# Patient Record
Sex: Male | Born: 1980 | Race: White | Hispanic: No | Marital: Single | Smoking: Current some day smoker
Health system: Southern US, Community
[De-identification: ages and names within clinical notes are randomized; demographics above are authoritative.]

## PROBLEM LIST (undated history)

## (undated) DIAGNOSIS — I10 Essential (primary) hypertension: Secondary | ICD-10-CM

## (undated) HISTORY — PX: TONSILLECTOMY: SUR1361

---

## 2020-11-08 ENCOUNTER — Other Ambulatory Visit: Payer: Self-pay

## 2020-11-08 ENCOUNTER — Encounter: Payer: Self-pay | Admitting: Emergency Medicine

## 2020-11-08 ENCOUNTER — Emergency Department: Payer: BC Managed Care – PPO

## 2020-11-08 ENCOUNTER — Emergency Department
Admission: EM | Admit: 2020-11-08 | Discharge: 2020-11-08 | Disposition: A | Discharge status: Home / Self Care | Payer: BC Managed Care – PPO | Attending: Emergency Medicine | Admitting: Emergency Medicine

## 2020-11-08 DIAGNOSIS — R0789 Other chest pain: Secondary | ICD-10-CM | POA: Diagnosis present

## 2020-11-08 DIAGNOSIS — F17200 Nicotine dependence, unspecified, uncomplicated: Secondary | ICD-10-CM | POA: Insufficient documentation

## 2020-11-08 DIAGNOSIS — I10 Essential (primary) hypertension: Secondary | ICD-10-CM | POA: Diagnosis not present

## 2020-11-08 HISTORY — DX: Essential (primary) hypertension: I10

## 2020-11-08 LAB — CBC
HCT: 45.2 % (ref 39.0–52.0)
Hemoglobin: 15.7 g/dL (ref 13.0–17.0)
MCH: 31.8 pg (ref 26.0–34.0)
MCHC: 34.7 g/dL (ref 30.0–36.0)
MCV: 91.7 fL (ref 80.0–100.0)
Platelets: 107 10*3/uL — ABNORMAL LOW (ref 150–400)
RBC: 4.93 MIL/uL (ref 4.22–5.81)
RDW: 12.9 % (ref 11.5–15.5)
WBC: 6.4 10*3/uL (ref 4.0–10.5)
nRBC: 0 % (ref 0.0–0.2)

## 2020-11-08 LAB — BASIC METABOLIC PANEL
Anion gap: 11 (ref 5–15)
BUN: 17 mg/dL (ref 6–20)
CO2: 24 mmol/L (ref 22–32)
Calcium: 9.5 mg/dL (ref 8.9–10.3)
Chloride: 99 mmol/L (ref 98–111)
Creatinine, Ser: 0.75 mg/dL (ref 0.61–1.24)
GFR, Estimated: >60 mL/min (ref >60)
Glucose, Bld: 106 mg/dL — ABNORMAL HIGH (ref 70–99)
Potassium: 4 mmol/L (ref 3.5–5.1)
Sodium: 134 mmol/L — ABNORMAL LOW (ref 135–145)

## 2020-11-08 LAB — TROPONIN I (HIGH SENSITIVITY): Troponin I (High Sensitivity): 5 ng/L (ref <18)

## 2020-11-08 MED ORDER — KETOROLAC TROMETHAMINE 30 MG/ML IJ SOLN
30.0000 mg | Freq: Once | INTRAMUSCULAR | Status: AC
  Administered 2020-11-08: 30 mg via INTRAVENOUS
  Filled 2020-11-08: qty 1

## 2020-11-08 MED ORDER — IOHEXOL 350 MG/ML SOLN
100.0000 mL | Freq: Once | INTRAVENOUS | Status: AC | PRN
  Administered 2020-11-08: 100 mL via INTRAVENOUS

## 2020-11-08 MED ORDER — CEFPODOXIME PROXETIL 200 MG PO TABS: 200.0000 mg | ORAL_TABLET | Freq: Two times a day (BID) | ORAL | 0 refills | Status: AC

## 2020-11-08 MED ORDER — AZITHROMYCIN 250 MG PO TABS: ORAL_TABLET | ORAL | 0 refills | Status: AC

## 2020-11-08 NOTE — ED Triage Notes (Signed)
Pt via POV from home. Pt c/o intermittent CP for the past couple of weeks. Pt states that today pain had been all day on the R-side of his CP. Denies radiation of pain. Denies NVD. Denies SOB. Pt has a hx of HTN but denies any other cardiac hx. Pt is A&Ox4 and NAD.

## 2020-11-08 NOTE — ED Provider Notes (Signed)
[facility] Emergency Department Provider Note   ____________________________________________    I have reviewed the triage vital signs and the nursing notes.   HISTORY  Chief Complaint Chest Pain     HPI Juan Mcpherson is a 40 y.o. male who presents with complaints of right-sided chest pain.  Patient reports intermittently has had discomfort in his chest however today has been more severe, he describes primarily right-sided chest pain, slightly worse when he coughs, denies fevers or chills.  No calf pain or swelling.  No history of blood clots.  No history of heart disease.  Does not smoke cigarettes.  Does not take anything for this.  No injury to the area.  Past Medical History:  Diagnosis Date  . Hypertension     There are no problems to display for this patient.   Past Surgical History:  Procedure Laterality Date  . TONSILLECTOMY      Prior to Admission medications   Medication Sig Start Date End Date Taking? Authorizing Provider  azithromycin (ZITHROMAX Z-PAK) 250 MG tablet Take 2 tablets (500 mg) on  Day 1,  followed by 1 tablet (250 mg) once daily on Days 2 through 5. 11/08/20 11/13/20 Yes Jene Every, MD  cefpodoxime (VANTIN) 200 MG tablet Take 1 tablet (200 mg total) by mouth 2 (two) times daily. 11/08/20  Yes Jene Every, MD     Allergies Penicillins  History reviewed. No pertinent family history.  Social History Social History   Tobacco Use  . Smoking status: Current Some Day Smoker  . Smokeless tobacco: Never Used  Substance Use Topics  . Alcohol use: Yes  . Drug use: Never    Review of Systems  Constitutional: No fever/chills Eyes: No visual changes.  ENT: No sore throat. Cardiovascular: As above Respiratory: Denies shortness of breath. Gastrointestinal: No nausea or vomiting Genitourinary: Negative for dysuria. Musculoskeletal: Negative for back pain. Skin: Negative for rash. Neurological: Negative for  headaches or weakness   ____________________________________________   PHYSICAL EXAM:  VITAL SIGNS: ED Triage Vitals  Enc Vitals Group     BP 11/08/20 1837 (!) 141/107     Pulse Rate 11/08/20 1837 97     Resp 11/08/20 1837 18     Temp 11/08/20 1837 98.5 F (36.9 C)     Temp Source 11/08/20 1837 Oral     SpO2 11/08/20 1837 99 %     Weight 11/08/20 1843 90.7 kg (200 lb)     Height 11/08/20 1843 1.803 m (5\' 11" )     Head Circumference --      Peak Flow --      Pain Score 11/08/20 1842 7     Pain Loc --      Pain Edu? --      Excl. in GC? --     Constitutional: Alert and oriented.   Nose: No congestion/rhinnorhea. Mouth/Throat: Mucous membranes are moist.   Neck:  Painless ROM Cardiovascular: Normal rate, regular rhythm. Grossly normal heart sounds.  Good peripheral circulation.  Tenderness palpation of the right pectoralis muscle, no rash, no mass palpated Respiratory: Normal respiratory effort.  No retractions. Lungs CTAB. Gastrointestinal: Soft and nontender. No distention.    Musculoskeletal: No lower extremity tenderness nor edema.  Warm and well perfused Neurologic:  Normal speech and language. No gross focal neurologic deficits are appreciated.  Skin:  Skin is warm, dry and intact. No rash noted. Psychiatric: Mood and affect are normal. Speech and behavior are normal.  ____________________________________________  LABS (all labs ordered are listed, but only abnormal results are displayed)  Labs Reviewed  BASIC METABOLIC PANEL - Abnormal; Notable for the following components:      Result Value   Sodium 134 (*)    Glucose, Bld 106 (*)    All other components within normal limits  CBC - Abnormal; Notable for the following components:   Platelets 107 (*)    All other components within normal limits  TROPONIN I (HIGH SENSITIVITY)   ____________________________________________  EKG  ED ECG REPORT I, Jene Every, the attending physician, personally viewed  and interpreted this ECG.  Date: 11/08/2020   Rhythm: normal sinus rhythm QRS Axis: normal Intervals: normal ST/T Wave abnormalities: normal Narrative Interpretation: no evidence of acute ischemia  ____________________________________________  RADIOLOGY  Chest x-ray viewed by me, no infiltrate or effusion CT angiography ____________________________________________   PROCEDURES  Procedure(s) performed: No  Procedures   Critical Care performed: No ____________________________________________   INITIAL IMPRESSION / ASSESSMENT AND PLAN / ED COURSE  Pertinent labs & imaging results that were available during my care of the patient were reviewed by me and considered in my medical decision making (see chart for details).  Patient presents with chest pain as detailed above, describes worsening with cough, certainly is tender over the right pectoralis muscle however no fluctuance or erythema injury or rash noted  Possibility of musculoskeletal chest pain although he denies any injury to the area, high-sensitivity troponin is normal, EKG is reassuring, will send for CT angiography,  We will treat with IV Toradol  CT angiography is reassuring, faint groundglass opacity, will cover with antibiotics, outpatient follow-up as needed   ____________________________________________   FINAL CLINICAL IMPRESSION(S) / ED DIAGNOSES  Final diagnoses:  Atypical chest pain        Note:  This document was prepared using Dragon voice recognition software and may include unintentional dictation errors.   Jene Every, MD 11/08/20 2245

## 2020-11-08 NOTE — ED Notes (Signed)
Pt to xray

## 2022-08-30 IMAGING — CT CT ANGIO CHEST
2 of 6 series · 18 of 46 positions shown · IV contrast (APPLIED)
Comparison: 11/08/2020 radiograph

CLINICAL DATA: Intermittent chest pain for the past couple weeks,
right-sided today

EXAM:
CT ANGIOGRAPHY CHEST WITH CONTRAST
TECHNIQUE: Multidetector CT imaging of the chest was performed using the
standard protocol during bolus administration of intravenous
contrast. Multiplanar CT image reconstructions and MIPs were
obtained to evaluate the vascular anatomy.
CONTRAST:  100mL OMNIPAQUE IOHEXOL 350 MG/ML SOLN

[Series 5: thins · axial · 0.71mm/px · z∈[-272,+43]mm · 15 of 345 slices shown]
[im 15/345  lung]
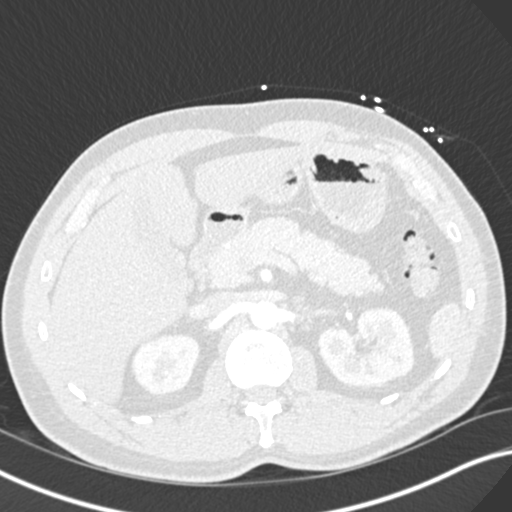
[im 45/345  soft-tissue]
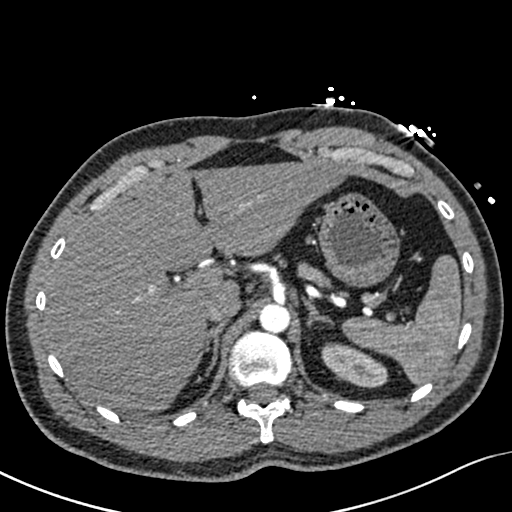
[im 60/345  lung]
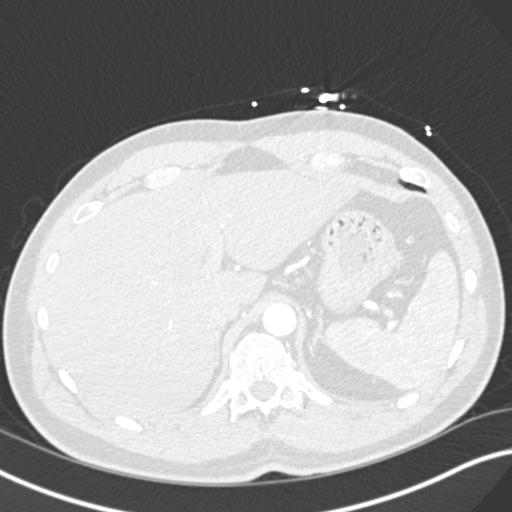
[im 90/345  soft-tissue]
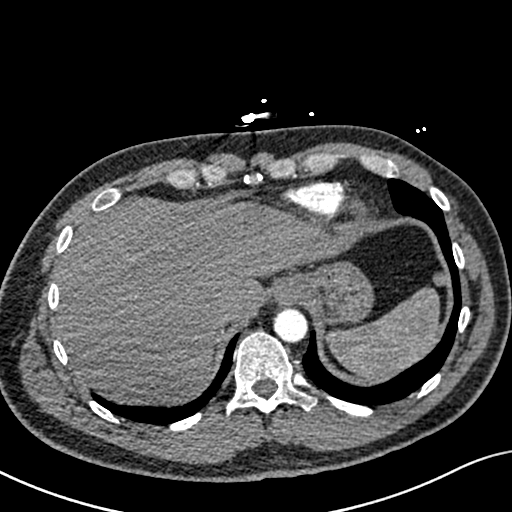
[im 105/345  lung]
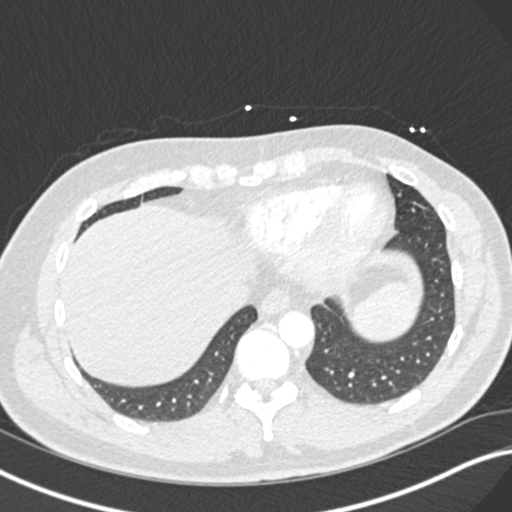
[im 135/345  soft-tissue]
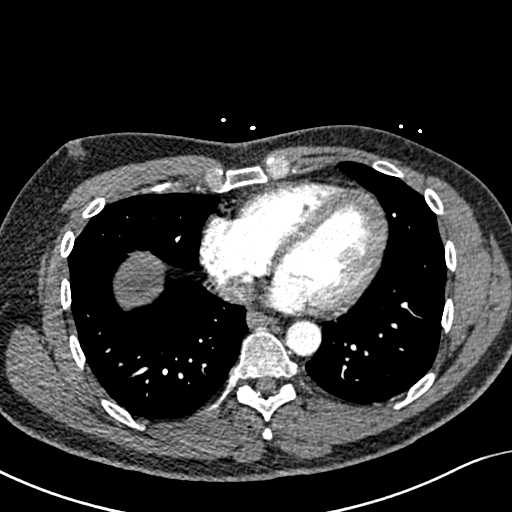
[im 150/345  lung]
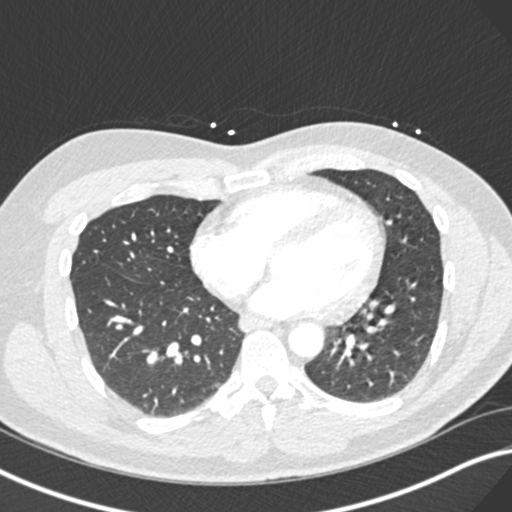
[im 180/345  soft-tissue]
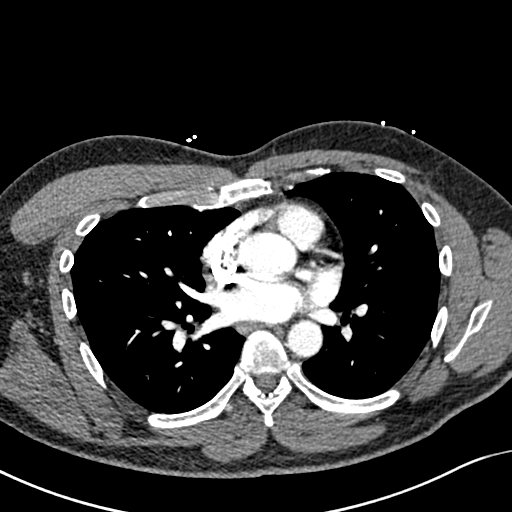
[im 195/345  lung]
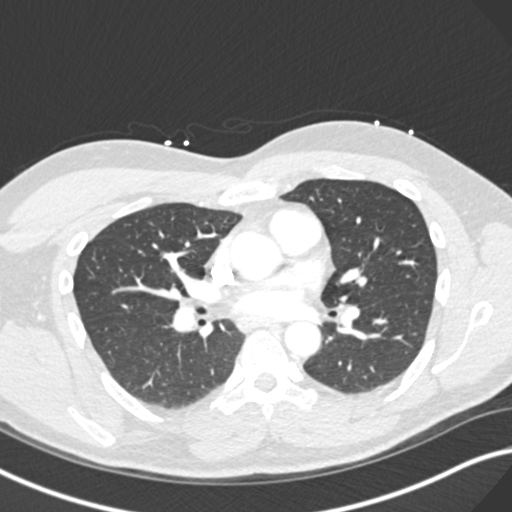
[im 210/345  soft-tissue]
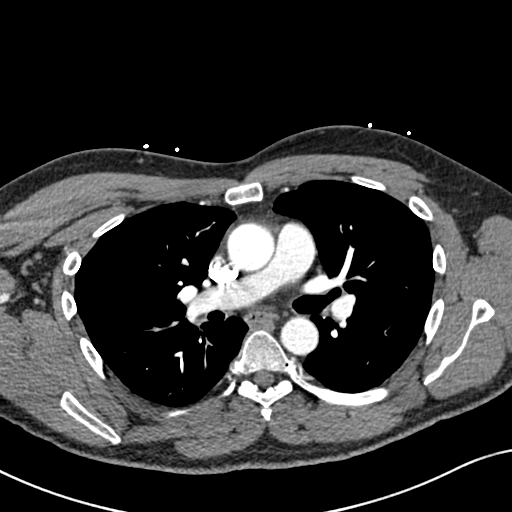
[im 240/345  lung]
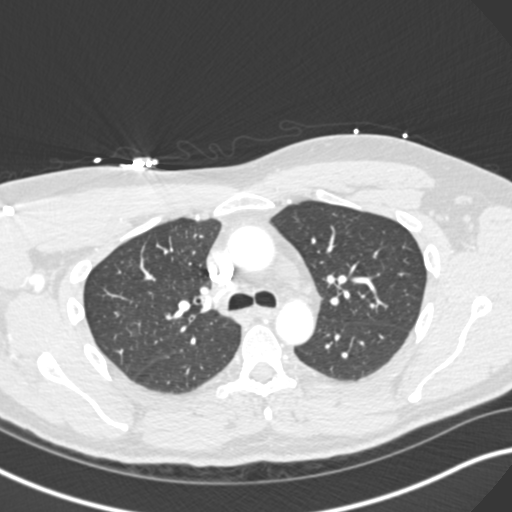
[im 255/345  soft-tissue]
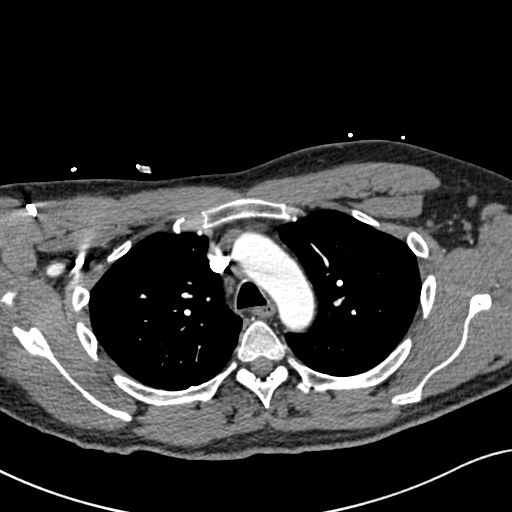
[im 285/345  lung]
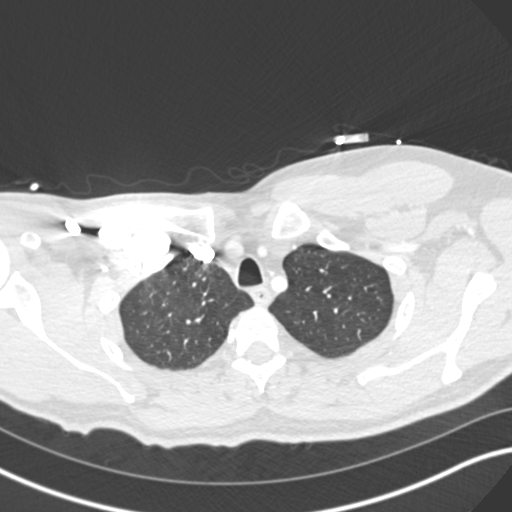
[im 300/345  soft-tissue]
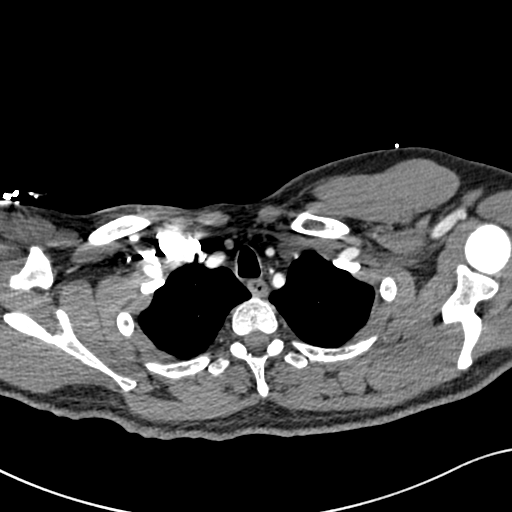
[im 330/345  lung]
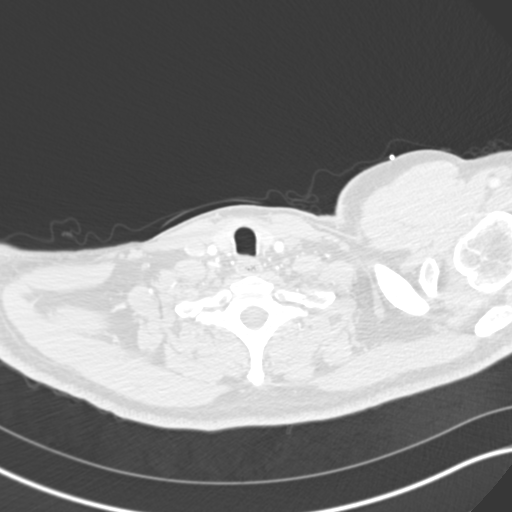

[Series 7: coronal mpr · coronal · 0.65mm/px · 3 of 85 slices shown]
[im 22/85  soft-tissue]
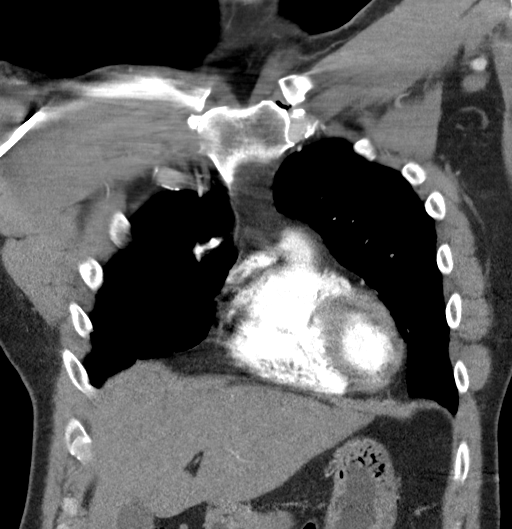
[im 43/85  soft-tissue]
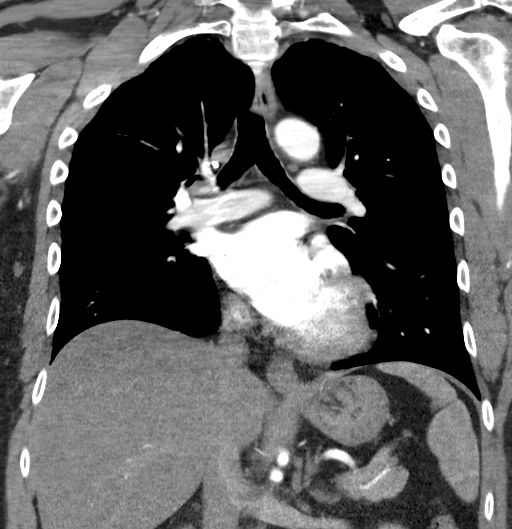
[im 64/85  soft-tissue]
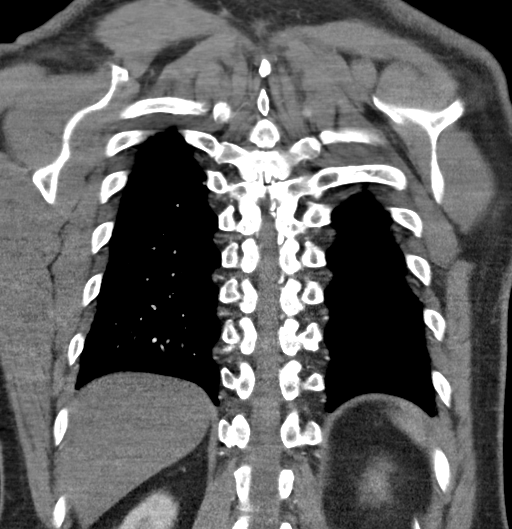

[18 of 46 positions shown; findings below may reference images not displayed]

FINDINGS: Cardiovascular: Satisfactory opacification the pulmonary arteries to
the segmental level. No pulmonary artery filling defects are
identified. Central pulmonary arteries are normal caliber. No acute
luminal abnormality of the imaged aorta. No periaortic stranding or
hemorrhage. Shared origin of the brachiocephalic and left common
carotid arteries. Left vertebral artery arises directly from the
aortic arch just proximal to the left subclavian origin. Proximal
great vessels are free of acute or worrisome abnormality. Normal
heart size. No pericardial effusion. No major venous abnormalities.

Mediastinum/Nodes: No mediastinal fluid or gas. Normal thyroid gland
and thoracic inlet. No acute abnormality of the trachea or
esophagus. No worrisome mediastinal, hilar or axillary adenopathy.

Lungs/Pleura: Some faint patchy ground-glass opacity is seen in the
right upper lobe. No other focal airspace disease. No convincing
features of edema. No pneumothorax or visible effusion. No
suspicious pulmonary nodules or masses.

Upper Abdomen: Diffuse hepatic hypoattenuation compatible with
hepatic steatosis. No acute abnormalities present in the visualized
portions of the upper abdomen.

Musculoskeletal: No acute osseous abnormality or suspicious osseous
lesion. No worrisome chest wall masses or lesions.

Review of the MIP images confirms the above findings.
IMPRESSION: 1. No evidence of pulmonary embolism.
2. Some faint patchy ground-glass opacity in the right upper lobe,
nonspecific, but could be seen in the setting of an infectious or
inflammatory process such as a bronchiolitis or atypical infection.
3. Hepatic steatosis.
